# Patient Record
Sex: Female | Born: 1985 | Race: White | Hispanic: No | Marital: Married | Smoking: Never smoker
Health system: Southern US, Community
[De-identification: ages and names within clinical notes are randomized; demographics above are authoritative.]

## PROBLEM LIST (undated history)

## (undated) DIAGNOSIS — Z789 Other specified health status: Secondary | ICD-10-CM

## (undated) HISTORY — PX: WISDOM TOOTH EXTRACTION: SHX21

## (undated) HISTORY — DX: Other specified health status: Z78.9

---
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
## 2005-02-01 ENCOUNTER — Ambulatory Visit: Payer: Self-pay | Admitting: Orthopedic Surgery

## 2005-02-06 ENCOUNTER — Encounter (HOSPITAL_COMMUNITY): Admission: RE | Admit: 2005-02-06 | Discharge: 2005-03-08 | Payer: Self-pay | Admitting: Orthopedic Surgery

## 2005-03-09 ENCOUNTER — Encounter (HOSPITAL_COMMUNITY): Admission: RE | Admit: 2005-03-09 | Discharge: 2005-04-08 | Payer: Self-pay | Admitting: Orthopedic Surgery

## 2005-03-28 ENCOUNTER — Ambulatory Visit: Payer: Self-pay | Admitting: Orthopedic Surgery

## 2005-12-24 ENCOUNTER — Ambulatory Visit (HOSPITAL_COMMUNITY): Admission: RE | Admit: 2005-12-24 | Discharge: 2005-12-24 | Payer: Self-pay | Admitting: Family Medicine

## 2007-07-03 ENCOUNTER — Other Ambulatory Visit: Admission: RE | Admit: 2007-07-03 | Discharge: 2007-07-03 | Payer: Self-pay | Admitting: Obstetrics and Gynecology

## 2008-09-09 ENCOUNTER — Other Ambulatory Visit: Admission: RE | Admit: 2008-09-09 | Discharge: 2008-09-09 | Payer: Self-pay | Admitting: Obstetrics and Gynecology

## 2009-11-15 ENCOUNTER — Other Ambulatory Visit: Admission: RE | Admit: 2009-11-15 | Discharge: 2009-11-15 | Payer: Self-pay | Admitting: Obstetrics and Gynecology

## 2019-09-13 ENCOUNTER — Ambulatory Visit: Payer: Self-pay | Attending: Internal Medicine

## 2019-09-13 ENCOUNTER — Other Ambulatory Visit: Payer: Self-pay

## 2019-09-13 DIAGNOSIS — Z23 Encounter for immunization: Secondary | ICD-10-CM | POA: Insufficient documentation

## 2019-09-13 NOTE — Progress Notes (Signed)
   Covid-19 Vaccination Clinic  Name:  Amanda Ford    MRN: [id number] DOB: 1985/12/15  09/13/2019  [name] was observed post Covid-19 immunization for 15 minutes without incidence. She was provided with Vaccine Information Sheet and instruction to access the V-Safe system.   [name] was instructed to call 911 with any severe reactions post vaccine: Marland Kitchen Difficulty breathing  . Swelling of your face and throat  . A fast heartbeat  . A bad rash all over your body  . Dizziness and weakness    Immunizations Administered    Name Date Dose VIS Date Route   Moderna COVID-19 Vaccine 09/13/2019  2:34 PM 0.5 mL 06/16/2019 Intramuscular   Manufacturer: Moderna   Lot: [id number]   NDC: 11464-314-27

## 2019-10-17 ENCOUNTER — Ambulatory Visit: Payer: Self-pay | Attending: Internal Medicine

## 2019-10-17 DIAGNOSIS — Z23 Encounter for immunization: Secondary | ICD-10-CM

## 2019-10-17 NOTE — Progress Notes (Signed)
   Covid-19 Vaccination Clinic  Name:  Amanda Ford    MRN: [id number] DOB: 1986-02-16  10/17/2019  [name] was observed post Covid-19 immunization for 15 minutes without incident. She was provided with Vaccine Information Sheet and instruction to access the V-Safe system.   [name] was instructed to call 911 with any severe reactions post vaccine: Marland Kitchen Difficulty breathing  . Swelling of face and throat  . A fast heartbeat  . A bad rash all over body  . Dizziness and weakness   Immunizations Administered    Name Date Dose VIS Date Route   Moderna COVID-19 Vaccine 10/17/2019 11:30 AM 0.5 mL 06/16/2019 Intramuscular   Manufacturer: Moderna   Lot: [id number]   NDC: 09735-329-92

## 2024-05-26 ENCOUNTER — Other Ambulatory Visit: Payer: Self-pay

## 2024-05-26 ENCOUNTER — Ambulatory Visit: Admitting: Family Medicine

## 2024-05-26 VITALS — BP 128/79 | HR 75 | Ht 66.0 in | Wt 188.0 lb

## 2024-05-26 DIAGNOSIS — Z3201 Encounter for pregnancy test, result positive: Secondary | ICD-10-CM | POA: Diagnosis not present

## 2024-05-26 LAB — POCT PREGNANCY, URINE: Preg Test, Ur: POSITIVE — AB

## 2024-05-26 NOTE — Patient Instructions (Addendum)
 Safe Medications in Pregnancy   Acne:  Benzoyl Peroxide  Salicylic Acid   Backache/Headache:  Tylenol: 2 regular strength every 4 hours OR               2 Extra strength every 6 hours   Colds/Coughs/Allergies:  Benadryl (alcohol free) 25 mg every 6 hours as needed  Breath right strips  Claritin  Cepacol throat lozenges  Chloraseptic throat spray  Cold-Eeze- up to three times per day  Cough drops, alcohol free  Flonase (by prescription only)  Guaifenesin  Mucinex  Robitussin DM (plain only, alcohol free)  Saline nasal spray/drops  Sudafed (pseudoephedrine) & Actifed * use only after [redacted] weeks gestation and if you do not have high blood pressure  Tylenol  Vicks Vaporub  Zinc lozenges  Zyrtec   Constipation:  Colace  Ducolax suppositories  Fleet enema  Glycerin suppositories  Metamucil  Milk of magnesia  Miralax  Senokot  Smooth move tea   Diarrhea:  Kaopectate  Imodium A-D   *NO pepto Bismol   Hemorrhoids:  Anusol  Anusol HC  Preparation H  Tucks   Indigestion:  Tums  Maalox  Mylanta  Zantac  Pepcid   Insomnia:  Benadryl (alcohol free) 25mg  every 6 hours as needed  Tylenol PM  Unisom, no Gelcaps   Leg Cramps:  Tums  MagGel   Nausea/Vomiting:  Bonine  Dramamine  Emetrol  Ginger extract  Sea bands  Meclizine  Nausea medication to take during pregnancy:  Unisom (doxylamine succinate 25 mg tablets) Take one tablet daily at bedtime. If symptoms are not adequately controlled, the dose can be increased to a maximum recommended dose of two tablets daily (1/2 tablet in the morning, 1/2 tablet mid-afternoon and one at bedtime).  Vitamin B6 100mg  tablets. Take one tablet twice a day (up to 200 mg per day).   Skin Rashes:  Aveeno products  Benadryl cream or 25mg  every 6 hours as needed  Calamine Lotion  1% cortisone cream   Yeast infection:  Gyne-lotrimin 7  Monistat 7    **If taking multiple medications, please check labels to avoid  duplicating the same active ingredients  **take medication as directed on the label  ** Do not exceed 4000 mg of tylenol in 24 hours  **Do not take medications that contain aspirin or ibuprofen           [facility] --- Address: [address] [place], [place], [place] [address]

## 2024-05-26 NOTE — Progress Notes (Signed)
  History:  [name] is a 38 y.o. G1P0000 who presents to clinic today with complaint of possible pregnancy.   UPT is positive Pregnancy is planned and welcome No medical conditions or abdominal surgeries Period was very regular prior to conception  No past medical history on file.  The following portions of the patient's history were reviewed and updated as appropriate: allergies, current medications, past family history, past medical history, past social history, past surgical history and problem list.   Review of Systems:  Pertinent items noted in HPI and remainder of comprehensive ROS otherwise negative.  Objective:  Physical Exam BP 128/79   Pulse 75   Ht 5' 6 (1.676 m)   Wt 188 lb (85.3 kg)   LMP 04/03/2024 (Exact Date)   BMI 30.34 kg/m  Physical Exam Vitals reviewed.  Constitutional:      General: She is not in acute distress.    Appearance: She is well-developed. She is not diaphoretic.  Eyes:     General: No scleral icterus. Pulmonary:     Effort: Pulmonary effort is normal. No respiratory distress.  Skin:    General: Skin is warm and dry.  Neurological:     Mental Status: She is alert.     Coordination: Coordination normal.      Labs and Imaging Results for orders placed or performed in visit on 05/26/24 (from the past 24 hours)  Pregnancy, urine POC     Status: Abnormal   Collection Time: 05/26/24  8:27 AM  Result Value Ref Range   Preg Test, Ur POSITIVE (A) NEGATIVE    No results found.   Assessment & Plan:  1. Positive pregnancy test Congratulated Regular period, dating US  not necessary Start prenatal care   Approximately 15 minutes of total time was spent with this patient on history taking, coordination of care, education and documentation.   [name], MD 05/26/2024 8:49 AM

## 2024-05-26 NOTE — Progress Notes (Signed)
 Possible Pregnancy  Here today for pregnancy confirmation. UPT in office today is positive. Pt reports first positive home UPT on 05/02/24. Reviewed dating with patient:   LMP: 04/03/24 EDD: 01/08/25 7w 4d today  OB history reviewed. Reviewed medications and allergies with patient; list of medications safe to take during pregnancy given.  Recommended pt begin prenatal vitamin and schedule prenatal care. Denies any vaginal bleeding and/or abdominal pain. MAU precautions reviewed. Denies any nausea. Reports regular 28 day cycles. Patient would like to begin care in office. Patient to get scheduled at checkout.  Dr. Lola welcomed patient to the clinic and all questions answered.    Rosaline Pendleton, RN 05/26/2024  8:21 AM

## 2024-06-17 ENCOUNTER — Telehealth

## 2024-06-17 DIAGNOSIS — Z3A10 10 weeks gestation of pregnancy: Secondary | ICD-10-CM | POA: Diagnosis not present

## 2024-06-17 DIAGNOSIS — O0991 Supervision of high risk pregnancy, unspecified, first trimester: Secondary | ICD-10-CM

## 2024-06-17 DIAGNOSIS — O09529 Supervision of elderly multigravida, unspecified trimester: Secondary | ICD-10-CM | POA: Insufficient documentation

## 2024-06-17 DIAGNOSIS — O0990 Supervision of high risk pregnancy, unspecified, unspecified trimester: Secondary | ICD-10-CM | POA: Insufficient documentation

## 2024-06-17 DIAGNOSIS — F419 Anxiety disorder, unspecified: Secondary | ICD-10-CM | POA: Insufficient documentation

## 2024-06-17 NOTE — Patient Instructions (Addendum)
 Safe Medications in Pregnancy   Acne:  Benzoyl Peroxide  Salicylic Acid   Backache/Headache:  Tylenol : 2 regular strength every 4 hours OR               2 Extra strength every 6 hours   Colds/Coughs/Allergies:  Benadryl  (alcohol free) 25 mg every 6 hours as needed  Breath right strips  Claritin  Cepacol throat lozenges  Chloraseptic throat spray  Cold-Eeze- up to three times per day  Cough drops, alcohol free  Flonase (by prescription only)  Guaifenesin  Mucinex  Robitussin DM (plain only, alcohol free)  Saline nasal spray/drops  Sudafed (pseudoephedrine) & Actifed * use only after [redacted] weeks gestation and if you do not have high blood pressure  Tylenol   Vicks Vaporub  Zinc lozenges  Zyrtec   Constipation:  Colace  Ducolax suppositories  Fleet enema  Glycerin  suppositories  Metamucil  Milk of magnesia  Miralax  Senokot  Smooth move tea   Diarrhea:  Kaopectate  Imodium A-D   *NO pepto Bismol   Hemorrhoids:  Anusol  Anusol HC  Preparation H  Tucks   Indigestion:  Tums  Maalox  Mylanta  Zantac  Pepcid   Insomnia:  Benadryl  (alcohol free) 25mg  every 6 hours as needed  Tylenol  PM  Unisom, no Gelcaps   Leg Cramps:  Tums  MagGel   Nausea/Vomiting:  Bonine  Dramamine  Emetrol  Ginger extract  Sea bands  Meclizine  Nausea medication to take during pregnancy:  Unisom (doxylamine succinate 25 mg tablets) Take one tablet daily at bedtime. If symptoms are not adequately controlled, the dose can be increased to a maximum recommended dose of two tablets daily (1/2 tablet in the morning, 1/2 tablet mid-afternoon and one at bedtime).  Vitamin B6 100mg  tablets. Take one tablet twice a day (up to 200 mg per day).   Skin Rashes:  Aveeno products  Benadryl  cream or 25mg  every 6 hours as needed  Calamine Lotion  1% cortisone cream   Yeast infection:  Gyne-lotrimin 7  Monistat 7    **If taking multiple medications, please check labels to avoid  duplicating the same active ingredients  **take medication as directed on the label  ** Do not exceed 4000 mg of tylenol  in 24 hours  **Do not take medications that contain aspirin or ibuprofen             If you are interested in an outpatient lactation consultation -- available in-office or virtually -- please reach out to us  at:  [facility] (First Floor) ?? [address], [place], [place]  ?? [phone] Please leave a message on our lactation voicemail box. We welcome any lactation-related questions or concerns -- our team is here to support you and your baby.  Lactation Support Groups Join us  at: [facility] ?? Tuesdays, 10:00 AM - 12:00 PM ?? [address], [employer], [place]  Lactating parents and lap babies are welcome!  ?? [email or web address]  ?? [email or web address]

## 2024-06-17 NOTE — Progress Notes (Signed)
 New OB Intake  I connected with Braiden  S Bitner  on 06/17/24 at  2:15 PM EST by MyChart Video Visit and verified that I am speaking with the correct person using two identifiers. Nurse is located at [facility] and pt is located at Home.  I discussed the limitations, risks, security and privacy concerns of performing an evaluation and management service by telephone and the availability of in person appointments. I also discussed with the patient that there may be a patient responsible charge related to this service. The patient expressed understanding and agreed to proceed.  I explained I am completing New OB Intake today. We discussed EDD of 01/08/25 based on LMP of 04/03/24. Pt is G1P0000. I reviewed her allergies, medications and Medical/Surgical/OB history.    Patient Active Problem List   Diagnosis Date Noted   Supervision of high risk pregnancy, antepartum 06/17/2024   Antepartum multigravida of advanced maternal age 38/09/2023     Concerns addressed today Dating ultrasound scheduled for 06/23/24 at 08:15am.  Delivery Plans Plans to deliver at [facility]. Discussed the nature of our practice with multiple providers including residents and students as well as female and female providers. Due to the size of the practice, the delivering provider may not be the same as those providing prenatal care.   Patient is not interested in water birth.  MyChart/Babyscripts MyChart access verified. I explained pt will have some visits in office and some virtually. Babyscripts instructions given and order placed. Patient verifies receipt of registration text/e-mail. Account successfully created and app downloaded. If patient is a candidate for Optimized scheduling, add to sticky note.   Blood Pressure Cuff/Weight Scale Patient has private insurance; instructed to purchase blood pressure cuff and bring to first prenatal appt. Explained after first prenatal appt pt will check weekly and document in Babyscripts.  Patient does not have weight scale; patient may purchase if they desire to track weight weekly in Babyscripts.  Anatomy US  Explained first scheduled US  will be around 19 weeks. Anatomy US  to be scheduled after viability US .  Is patient a CenteringPregnancy candidate?  Declined due to Group setting     Is patient a Mom+Baby Combined Care candidate?  Declined    Is patient a candidate for Babyscripts Optimization? Yes, patient declined.   First visit review I reviewed new OB appt with patient. Explained pt will be seen by Dr. Cleatus at first visit 06/23/24 at 09:15. Discussed [place] genetic screening with patient. Pt desires [facility]. Routine prenatal labs, genetic testing, and pap smear needed at [facility].   Last Pap Patient states her last pap smear was in 2022 in [place], to her knowledge the results were normal.  Advised pt that provider may recommend routine screening at her New OB Visit or postpartum.  Waddell LITTIE Burows, RN 06/17/2024  3:13 PM

## 2024-06-23 ENCOUNTER — Encounter: Payer: Self-pay | Admitting: Obstetrics and Gynecology

## 2024-06-23 ENCOUNTER — Other Ambulatory Visit

## 2024-06-29 ENCOUNTER — Other Ambulatory Visit

## 2024-06-30 ENCOUNTER — Other Ambulatory Visit: Payer: Self-pay | Admitting: Obstetrics and Gynecology

## 2024-06-30 ENCOUNTER — Ambulatory Visit: Admitting: Certified Nurse Midwife

## 2024-06-30 ENCOUNTER — Encounter: Payer: Self-pay | Admitting: Certified Nurse Midwife

## 2024-06-30 ENCOUNTER — Other Ambulatory Visit

## 2024-06-30 ENCOUNTER — Other Ambulatory Visit: Payer: Self-pay

## 2024-06-30 ENCOUNTER — Other Ambulatory Visit (HOSPITAL_COMMUNITY)
Admission: RE | Admit: 2024-06-30 | Discharge: 2024-06-30 | Disposition: A | Source: Ambulatory Visit | Attending: Certified Nurse Midwife | Admitting: Certified Nurse Midwife

## 2024-06-30 VITALS — BP 139/90 | HR 81 | Wt 188.2 lb

## 2024-06-30 DIAGNOSIS — Z23 Encounter for immunization: Secondary | ICD-10-CM | POA: Diagnosis not present

## 2024-06-30 DIAGNOSIS — O09529 Supervision of elderly multigravida, unspecified trimester: Secondary | ICD-10-CM

## 2024-06-30 DIAGNOSIS — K219 Gastro-esophageal reflux disease without esophagitis: Secondary | ICD-10-CM

## 2024-06-30 DIAGNOSIS — O0991 Supervision of high risk pregnancy, unspecified, first trimester: Secondary | ICD-10-CM

## 2024-06-30 DIAGNOSIS — O0990 Supervision of high risk pregnancy, unspecified, unspecified trimester: Secondary | ICD-10-CM | POA: Diagnosis present

## 2024-06-30 DIAGNOSIS — Z3A12 12 weeks gestation of pregnancy: Secondary | ICD-10-CM | POA: Diagnosis not present

## 2024-06-30 DIAGNOSIS — Z3401 Encounter for supervision of normal first pregnancy, first trimester: Secondary | ICD-10-CM

## 2024-06-30 MED ORDER — ASPIRIN 81 MG PO TBEC: 81.0000 mg | DELAYED_RELEASE_TABLET | Freq: Every day | ORAL | 2 refills | Status: AC

## 2024-06-30 MED ORDER — FAMOTIDINE 20 MG PO TABS: 20.0000 mg | ORAL_TABLET | Freq: Two times a day (BID) | ORAL | 3 refills | Status: AC

## 2024-06-30 NOTE — Patient Instructions (Addendum)
We highly recommend childbirth education to help you plan for labor and begin practicing coping skills (which will be needed with or without pain meds).  [place] Childbirth Education Options: Sign up by visiting [email or web address]  Childbirth ~ Self-Paced eClass (English and Spanish) This online class offers you the freedom to complete a childbirth education series in the comfort of your own home at your own pace.  Childbirth Class (In-Person 4-Week Series  or on Saturdays, Virtual 4-Week Series ~ [place]) This interactive in-person class series will help you and your partner prepare for your birth experience. Topics include: Labor & Birth, Comfort Measures, Breathing Techniques, Massage, Medical Interventions, Pain Management Options, Cesarean Birth, Postpartum Care, and Newborn Care  Comfort Techniques for Labor ~ In-Person Class ([place]) This interactive class is designed for parents-to-be who want to learn & practice hands-on skills to help relieve some of the discomfort of labor and encourage their babies to rotate toward the best position for birth. Moms and their partners will be able to try a variety of labor positions with birth balls and rebozos as well as practice breathing, relaxation, and visualization techniques.  Natural Childbirth Class (In-Person 5-Week Series, In-Person on Saturdays or Virtual 5-Week Series ~ [place]) This class series is designed for expectant parents who want to learn and practice natural methods of coping with the process of labor and childbirth.  Cesarean Birth Self-Paced eClass (English and Spanish) This online course provides comprehensive information you can trust as you prepare for a possible cesarean birth. In this class, you'll learn how to make your birth and recovery comfortable and joyful through instructive video clips, animations, and activities.  Waterbirth ~ Virtual Class Interested in a waterbirth? In addition to a consultation  with your credentialed waterbirth provider, this free, informational online class will help you discover whether waterbirth is the right fit for you. Not all obstetrical practices offer waterbirth, so check with your healthcare provider.  Tour (Self-Paced Video) - [facility] [place] Watch our 4 minute video tour of [facility] located in [place].   [place] Parenting Education Options:  Pregnancy 101 (Virtual) Congratulations on your pregnancy! This class is geared toward moms in their first trimester, but everyone is welcome. We are excited to guide you through all aspects of supporting a healthy pregnancy. You will learn what to expect at routine prenatal care appointments, common postpartum adjustments, basic infant safety, and breastfeeding.  Successful Partnering & Parenting ~ In-Person Workshop ([place]) This workshop inspires and equips partners of all economic levels, ages, and cultures to confidently care for their infants, support the birthing persons, and navigate their own transformations into new partners and parents. Learning activities are geared towards supporting partner, but moms are welcome to attend.  'Baby & Me' Parenting Group (Virtual on Wednesdays at 11am) Enjoy this time discussing newborn & infant parenting topics and family adjustment issues with other new parents in a relaxed environment. Each week brings a new speaker or baby-centered activity. This group offers support and connection to parents as they journey through the adjustments and struggles of that sometimes overwhelming first year after the birth of a child.  Baby Safety, CPR, & Choking Class ~ Virtual This life-saving information is meant to encourage parents as they learn important safety and prevention tips as well as infant CPR and relief of choking.  Breastfeeding Class (In-Person in [place] or Virtual) Families learn what to expect in the  first days and weeks of breastfeeding your   newborn.   Breastfeeding Self-Paced eClass (English & Spanish) Families learn what to expect in the first days and weeks of breastfeeding your newborn.  Caring for Baby ~ In-Person, Virtual or Self-Paced Class This in-person class is for both expectant and adoptive parents who want to learn and practice the most up-to-date newborn care for their babies. Focus is on birth through the first six weeks of life.  CPR & Choking Relief for Infants & Children ~ In-Person Class ([place]) This in-person course is designed for any parent, expectant parent, or adult who cares for infants or children. Participants learn and demonstrate cardiopulmonary resuscitation and choking relief procedures for both infants and children.  Grandparent Love ~ In-Person Class Grandparents will learn the most updated infant care and safety recommendations. They will discover ways to support their own children during the transition into the parenting role and receive tips on communicating with the new parents.  [place] Parenting Support Group Options:  Bereavement Grief Support Group (Pregnancy/Infant Loss) - Virtual This is an ongoing experience that meets once a month and is designed to help you honor the past, assist you in discovering tools to strengthen you today, and aid you in developing hope for the future.  Breastfeeding & Pumping Support Group (In-Person on Thursdays at 12pm or Virtual on Tuesdays at 5pm) Join us in-person each Thursday starting June 1st, 2023 at 12pm! This support group is free for all families looking for breastfeeding and/or pumping support.   Community-Based Childbirth Education Options:  [place] [facility] Classes:  Childbirth education classes can help you get ready for a positive parenting experience. You can also meet other expectant parents and get free stuff for your baby. Each class runs for five weeks on the same night  and costs $45 for the mother-to-be and her support person. Medicaid covers the cost if you are eligible. Call [phone] to register.  [facility] [employer] offers a variety of programs for the [place] community and is another great way to get connected. Please go to [email or web address] for more information.  Childbirth With A Twist! Be informed of your options, get educated on birth, understand what your body is doing, learn how to cope, and have a lot of fun and laughs all while doing it either from the comfort of your couch OR in our cozy office and classroom space near the [place] airport. If you are taking a virtual class, then class is taught LIVE, so you can ask questions and receive answers in real-time from an experienced doula and childbirth educator.  This virtual childbirth education class will meet for five instruction times online.  Although we are based in [place], [place], this virtual class is open to anyone in the world. Please visit: [email or web address] for more information.  Books We Love: The Doula Guide to Childbirth by Ananda Lowe and Rachel Zimmerman The First-Time Parent's Childbirth Handbook by Dr. Stephanie Mitchell, CNM The Birth Partner by Penny Simkin  

## 2024-06-30 NOTE — Progress Notes (Signed)
 History:   Amanda Ford is a 38 y.o. G1P0000 at [redacted]w[redacted]d by LMP, early ultrasound being seen today for her first obstetrical visit.  Her obstetrical history is significant for advanced maternal age and primigravida. Patient does intend to breast feed. Pregnancy history fully reviewed.  Patient reports nausea.      HISTORY: OB History  Gravida Para Term Preterm AB Living  1 0 0 0 0 0  SAB IAB Ectopic Multiple Live Births  0 0 0 0 0    # Outcome Date GA Lbr Len/2nd Weight Sex Type Anes PTL Lv  1 Current             Pap past due per records, obtained today.   Past Medical History:  Diagnosis Date   Medical history non-contributory    Past Surgical History:  Procedure Laterality Date   WISDOM TOOTH EXTRACTION     Family History  Problem Relation Age of Onset   Hypertension Father    Atrial fibrillation Father    Breast cancer Paternal Grandfather    Social History[1] Allergies[2] Medications Ordered Prior to Encounter[3]  Review of Systems Pertinent items noted in HPI and remainder of comprehensive ROS otherwise negative. Physical Exam:   Vitals:   06/30/24 1128  BP: (!) 139/90  Pulse: 81  Weight: 188 lb 3.2 oz (85.4 kg)   Fetal Heart Rate (bpm):  (US )  Constitutional: Well-developed, well-nourished pregnant female in no acute distress.  HEENT: PERRLA Skin: normal color and turgor, no rash Cardiovascular: normal rate & rhythm, warm and well perfused Respiratory: normal effort, no problems with respiration noted GI: Abd soft, non-distended MS: Extremities nontender, no edema, normal ROM Neurologic: Alert and oriented x 4.  GU: no CVA tenderness Pelvic: NEFG, physiologic discharge, no blood, cervix clean. Pap/swabs collected   Assessment:    Pregnancy: G1P0000 Patient Active Problem List   Diagnosis Date Noted   Supervision of high risk pregnancy, antepartum 06/17/2024   Antepartum multigravida of advanced maternal age 55/09/2023   Anxiety 06/17/2024      Plan:    1. Supervision of high risk pregnancy, antepartum (Primary) - Culture, OB Urine - CBC/D/Plt+RPR+Rh+ABO+RubIgG... - GC/Chlamydia probe amp ([place])not at [facility] - Hemoglobin A1c - PANORAMA PRENATAL TEST - HORIZON Basic Panel - Cytology - PAP( Dinosaur) - Flu vaccine trivalent PF, 6mos and older(Flulaval,Afluria,Fluarix,Fluzone) - aspirin  EC 81 MG tablet; Take 1 tablet (81 mg total) by mouth daily. Take after 12 weeks for prevention of preeclampsia later in pregnancy  Dispense: 300 tablet; Refill: 2 - Vitamin D  (25 hydroxy)  2. [redacted] weeks gestation of pregnancy - Routine PNC, anticipatory guidance.   3. Primigravida in first trimester - Start 81mg  ASA daily.   4. Gastroesophageal reflux disease without esophagitis - famotidine  (PEPCID ) 20 MG tablet; Take 1 tablet (20 mg total) by mouth 2 (two) times daily.  Dispense: 60 tablet; Refill: 3   - Initial labs drawn. - Continue prenatal vitamins. - Problem list reviewed and updated. - Genetic Screening discussed, First trimester screen, Quad screen, and NIPS: ordered. - Ultrasound discussed; fetal anatomic survey: requested. - Anticipatory guidance about prenatal visits given including labs, ultrasounds, and testing. - Discussed usage of Babyscripts and virtual visits as additional source of managing and completing prenatal visits in midst of coronavirus and pandemic.   - Encouraged to complete MyChart Registration for her ability to review results, send requests, and have questions addressed.  - The nature of [facility]  with multiple MDs and Advanced Practice Providers was explained to patient; also emphasized that residents, students are part of our team. - Routine obstetric precautions reviewed. Encouraged to seek out care at office or emergency room [facility] preferred) for urgent and/or emergent concerns.  Early screening tests: FBS, A1C, Random CBG, glucose  challenge Return in about 4 weeks (around 07/28/2024) for HROB.    Future Appointments  Date Time Provider Department Center  07/30/2024 10:15 AM Zina Jerilynn LABOR, MD [facility]  08/27/2024 11:15 AM [facility]  09/22/2024 10:55 AM [facility]    Camie Rote, MSN, CNM, RNC-OB Certified Nurse Midwife, [facility] 06/30/2024 1:06 PM       [1]  Social History Tobacco Use   Smoking status: Never   Smokeless tobacco: Never  Vaping Use   Vaping status: Never Used  Substance Use Topics   Alcohol use: Not Currently    Comment: none while pregnant   Drug use: Never  [2] No Known Allergies [3]  Current Outpatient Medications on File Prior to Visit  Medication Sig Dispense Refill   prenatal vitamin w/FE, FA (PRENATAL 1 + 1) 27-1 MG TABS tablet Take 1 tablet by mouth daily at 12 noon.     No current facility-administered medications on file prior to visit.

## 2024-07-01 ENCOUNTER — Ambulatory Visit: Payer: Self-pay | Admitting: Certified Nurse Midwife

## 2024-07-01 LAB — CBC/D/PLT+RPR+RH+ABO+RUBIGG...
Antibody Screen: NEGATIVE
Basophils Absolute: 0 x10E3/uL (ref 0.0–0.2)
Basos: 0 %
EOS (ABSOLUTE): 0 x10E3/uL (ref 0.0–0.4)
Eos: 0 %
HCV Ab: NONREACTIVE
HIV Screen 4th Generation wRfx: NONREACTIVE
Hematocrit: 40.5 % (ref 34.0–46.6)
Hemoglobin: 13.6 g/dL (ref 11.1–15.9)
Hepatitis B Surface Ag: NEGATIVE
Immature Grans (Abs): 0 x10E3/uL (ref 0.0–0.1)
Immature Granulocytes: 0 %
Lymphocytes Absolute: 1.1 x10E3/uL (ref 0.7–3.1)
Lymphs: 14 %
MCH: 32.8 pg (ref 26.6–33.0)
MCHC: 33.6 g/dL (ref 31.5–35.7)
MCV: 98 fL — ABNORMAL HIGH (ref 79–97)
Monocytes Absolute: 0.3 x10E3/uL (ref 0.1–0.9)
Monocytes: 4 %
Neutrophils Absolute: 6.6 x10E3/uL (ref 1.4–7.0)
Neutrophils: 82 %
Platelets: 241 x10E3/uL (ref 150–450)
RBC: 4.15 x10E6/uL (ref 3.77–5.28)
RDW: 12.2 % (ref 11.7–15.4)
RPR Ser Ql: NONREACTIVE
Rh Factor: POSITIVE
Rubella Antibodies, IGG: 3.12 {index} (ref >0.99)
WBC: 8.1 x10E3/uL (ref 3.4–10.8)

## 2024-07-01 LAB — GC/CHLAMYDIA PROBE AMP (~~LOC~~) NOT AT ARMC
Chlamydia: NEGATIVE
Comment: NEGATIVE
Comment: NORMAL
Neisseria Gonorrhea: NEGATIVE

## 2024-07-01 LAB — HEMOGLOBIN A1C
Est. average glucose Bld gHb Est-mCnc: 103 mg/dL
Hgb A1c MFr Bld: 5.2 % (ref 4.8–5.6)

## 2024-07-01 LAB — VITAMIN D 25 HYDROXY (VIT D DEFICIENCY, FRACTURES): Vit D, 25-Hydroxy: 27.7 ng/mL — ABNORMAL LOW (ref 30.0–100.0)

## 2024-07-01 LAB — HCV INTERPRETATION

## 2024-07-02 LAB — URINE CULTURE, OB REFLEX: Organism ID, Bacteria: NO GROWTH

## 2024-07-02 LAB — CYTOLOGY - PAP
Comment: NEGATIVE
Diagnosis: NEGATIVE
High risk HPV: NEGATIVE

## 2024-07-02 LAB — CULTURE, OB URINE

## 2024-07-06 ENCOUNTER — Encounter: Payer: Self-pay | Admitting: General Practice

## 2024-07-07 LAB — PANORAMA PRENATAL TEST FULL PANEL:PANORAMA TEST PLUS 5 ADDITIONAL MICRODELETIONS: FETAL FRACTION: 5

## 2024-07-12 LAB — HORIZON CUSTOM: REPORT SUMMARY: NEGATIVE

## 2024-07-29 ENCOUNTER — Telehealth: Payer: Self-pay

## 2024-07-30 ENCOUNTER — Ambulatory Visit: Payer: Self-pay | Admitting: Obstetrics and Gynecology

## 2024-07-30 ENCOUNTER — Other Ambulatory Visit: Payer: Self-pay

## 2024-07-30 VITALS — BP 135/85 | HR 85 | Wt 194.0 lb

## 2024-07-30 DIAGNOSIS — O09522 Supervision of elderly multigravida, second trimester: Secondary | ICD-10-CM

## 2024-07-30 DIAGNOSIS — Z3A16 16 weeks gestation of pregnancy: Secondary | ICD-10-CM | POA: Diagnosis not present

## 2024-07-30 DIAGNOSIS — O0992 Supervision of high risk pregnancy, unspecified, second trimester: Secondary | ICD-10-CM

## 2024-07-30 DIAGNOSIS — O0990 Supervision of high risk pregnancy, unspecified, unspecified trimester: Secondary | ICD-10-CM

## 2024-07-30 DIAGNOSIS — O09529 Supervision of elderly multigravida, unspecified trimester: Secondary | ICD-10-CM

## 2024-07-30 NOTE — Patient Instructions (Signed)

## 2024-07-30 NOTE — Progress Notes (Signed)
" ° °  PRENATAL VISIT NOTE  Subjective:  Amanda Ford is a 39 y.o. G1P0000 at [redacted]w[redacted]d being seen today for ongoing prenatal care.  She is currently monitored for the following issues for this low-risk pregnancy and has Supervision of high risk pregnancy, antepartum; Antepartum multigravida of advanced maternal age; and Anxiety on their problem list.  Patient doing well with no acute concerns today. She reports no complaints.  Contractions: Not present. Vag. Bleeding: None.   . Denies leaking of fluid.   The following portions of the patient's history were reviewed and updated as appropriate: allergies, current medications, past family history, past medical history, past social history, past surgical history and problem list. Problem list updated.  Objective:   Vitals:   07/30/24 1019  BP: 135/85  Pulse: 85  Weight: 194 lb (88 kg)    Fetal Status: Fetal Heart Rate (bpm): 154 Fundal Height: 17 cm       General:  Alert, oriented and cooperative. Patient is in no acute distress.  Skin: Skin is warm and dry. No rash noted.   Cardiovascular: Normal heart rate noted  Respiratory: Normal respiratory effort, no problems with respiration noted  Abdomen: Soft, gravid, appropriate for gestational age.  Pain/Pressure: Absent     Pelvic: Cervical exam deferred        Extremities: Normal range of motion.  Edema: None  Mental Status:  Normal mood and affect. Normal behavior. Normal judgment and thought content.   Assessment and Plan:  Pregnancy: G1P0000 at [redacted]w[redacted]d  1. Supervision of high risk pregnancy, antepartum (Primary) Continue routine prenatal care - AFP, Serum, Open Spina Bifida  2. [redacted] weeks gestation of pregnancy   3. Antepartum multigravida of advanced maternal age   Preterm labor symptoms and general obstetric precautions including but not limited to vaginal bleeding, contractions, leaking of fluid and fetal movement were reviewed in detail with the patient.  Please refer to After  Visit Summary for other counseling recommendations.   Return in about 4 weeks (around 08/27/2024) for ROB, in person.   Jerilynn Buddle, MD Faculty Attending [facility]   "

## 2024-08-03 LAB — AFP, SERUM, OPEN SPINA BIFIDA
AFP MoM: 0.74
AFP Value: 23.1 ng/mL
Gest. Age on Collection Date: 16.9 wk
Maternal Age At EDD: 38.6 a
OSBR Risk 1 IN: 10000
Test Results:: NEGATIVE
Weight: 194 [lb_av]

## 2024-08-04 ENCOUNTER — Ambulatory Visit: Payer: Self-pay | Admitting: Obstetrics and Gynecology

## 2024-08-26 ENCOUNTER — Ambulatory Visit

## 2024-08-26 ENCOUNTER — Other Ambulatory Visit

## 2024-08-27 ENCOUNTER — Encounter: Payer: Self-pay | Admitting: Obstetrics and Gynecology
